# Patient Record
Sex: Male | Born: 1990 | Race: White | Hispanic: No | Marital: Married | Smoking: Never smoker
Health system: Southern US, Community
[De-identification: ages and names within clinical notes are randomized; demographics above are authoritative.]

---
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
## 2012-01-27 ENCOUNTER — Emergency Department: Payer: Self-pay | Admitting: Emergency Medicine

## 2019-10-16 ENCOUNTER — Ambulatory Visit: Payer: Self-pay | Attending: Internal Medicine

## 2019-10-16 DIAGNOSIS — Z20822 Contact with and (suspected) exposure to covid-19: Secondary | ICD-10-CM

## 2019-10-17 LAB — NOVEL CORONAVIRUS, NAA: SARS-CoV-2, NAA: NOT DETECTED

## 2022-01-01 ENCOUNTER — Other Ambulatory Visit: Payer: Self-pay

## 2022-01-01 ENCOUNTER — Emergency Department
Admission: EM | Admit: 2022-01-01 | Discharge: 2022-01-01 | Disposition: A | Discharge status: Home / Self Care | Payer: BC Managed Care – PPO | Attending: Emergency Medicine | Admitting: Emergency Medicine

## 2022-01-01 DIAGNOSIS — L03113 Cellulitis of right upper limb: Secondary | ICD-10-CM | POA: Diagnosis not present

## 2022-01-01 DIAGNOSIS — R21 Rash and other nonspecific skin eruption: Secondary | ICD-10-CM | POA: Diagnosis present

## 2022-01-01 LAB — BASIC METABOLIC PANEL
Anion gap: 8 (ref 5–15)
BUN: 16 mg/dL (ref 6–20)
CO2: 27 mmol/L (ref 22–32)
Calcium: 9.1 mg/dL (ref 8.9–10.3)
Chloride: 101 mmol/L (ref 98–111)
Creatinine, Ser: 1.35 mg/dL — ABNORMAL HIGH (ref 0.61–1.24)
GFR, Estimated: >60 mL/min (ref >60)
Glucose, Bld: 88 mg/dL (ref 70–99)
Potassium: 4 mmol/L (ref 3.5–5.1)
Sodium: 136 mmol/L (ref 135–145)

## 2022-01-01 LAB — CBC
HCT: 43.8 % (ref 39.0–52.0)
Hemoglobin: 15.3 g/dL (ref 13.0–17.0)
MCH: 29.8 pg (ref 26.0–34.0)
MCHC: 34.9 g/dL (ref 30.0–36.0)
MCV: 85.2 fL (ref 80.0–100.0)
Platelets: 267 10*3/uL (ref 150–400)
RBC: 5.14 MIL/uL (ref 4.22–5.81)
RDW: 12.4 % (ref 11.5–15.5)
WBC: 14 10*3/uL — ABNORMAL HIGH (ref 4.0–10.5)
nRBC: 0 % (ref 0.0–0.2)

## 2022-01-01 MED ORDER — SULFAMETHOXAZOLE-TRIMETHOPRIM 800-160 MG PO TABS: 1.0000 | ORAL_TABLET | Freq: Two times a day (BID) | ORAL | 0 refills | Status: AC

## 2022-01-01 MED ORDER — SULFAMETHOXAZOLE-TRIMETHOPRIM 800-160 MG PO TABS
1.0000 | ORAL_TABLET | Freq: Once | ORAL | Status: AC
  Administered 2022-01-01: 1 via ORAL
  Filled 2022-01-01: qty 1

## 2022-01-01 NOTE — Discharge Instructions (Addendum)
Use the Bactrim antibiotic twice daily for the next 7 days to treat the cellulitis/skin infection to your right arm. ? ?Keep track of the area, if it continues to enlarge significantly despite antibiotics, please return to the ED. ? ?If your elbow is very painful to move, cannot feel or use your right hand, fevers or feeling sick overall, please return to the ED.  ? ?Please take Tylenol and ibuprofen/Advil for your pain.  It is safe to take them together, or to alternate them every few hours.  Take up to 1000mg  of Tylenol at a time, up to 4 times per day.  Do not take more than 4000 mg of Tylenol in 24 hours.  For ibuprofen, take 400-600 mg, 4-5 times per day. ? ?Elevate the area, use ice and mild compression to help with the symptoms. ?

## 2022-01-01 NOTE — ED Provider Notes (Signed)
? ?[facility] ?Provider Note ? ? ? Event Date/Time  ? First MD Initiated Contact with Patient 01/01/22 2207   ?  (approximate) ? ? ?History  ? ?Rash ? ? ?HPI ? ?Rodney Fields. is a 31 y.o. male who presents to the ED for evaluation of Rash ?  ?Morbidly obese patient. ? ?Patient presents to the ED for evaluation of painful and itching area of swelling and rash to his right forearm.  Denies any trauma or injuries.  Reports possibly being bit by a bug.  Denies fevers or systemic symptoms.  No recent antibiotics. ? ?Physical Exam  ? ?Triage Vital Signs: ?ED Triage Vitals  ?Enc Vitals Group  ?   BP 01/01/22 2154 (!) 140/93  ?   Pulse Rate 01/01/22 2154 81  ?   Resp 01/01/22 2154 18  ?   Temp 01/01/22 2154 98.7 ?F (37.1 ?C)  ?   Temp Source 01/01/22 2154 Oral  ?   SpO2 01/01/22 2154 97 %  ?   Weight 01/01/22 2155 245 lb (111.1 kg)  ?   Height 01/01/22 2155 5\' 11"  (1.803 m)  ?   Head Circumference --   ?   Peak Flow --   ?   Pain Score 01/01/22 2154 6  ?   Pain Loc --   ?   Pain Edu? --   ?   Excl. in GC? --   ? ? ?Most recent vital signs: ?Vitals:  ? 01/01/22 2154  ?BP: (!) 140/93  ?Pulse: 81  ?Resp: 18  ?Temp: 98.7 ?F (37.1 ?C)  ?SpO2: 97%  ? ? ?General: Awake, no distress.  ?CV:  Good peripheral perfusion.  ?Resp:  Normal effort.  ?Abd:  No distention.  ?MSK:  No deformity noted.  Area of erythema and induration, circumscribed by a marker, as pictured below.  No fluctuance or anything coming to ahead.  No purulence.  Soft tissue swelling is present.  Full active and passive ROM of the right elbow and wrist without significant pain. ?Neuro:  No focal deficits appreciated. ?Other:   ? ? ? ? ?ED Results / Procedures / Treatments  ? ?Labs ?(all labs ordered are listed, but only abnormal results are displayed) ?Labs Reviewed  ?CBC - Abnormal; Notable for the following components:  ?    Result Value  ? WBC 14.0 (*)   ? All other components within normal limits  ?BASIC METABOLIC PANEL - Abnormal;  Notable for the following components:  ? Creatinine, Ser 1.35 (*)   ? All other components within normal limits  ? ? ?EKG ? ? ?RADIOLOGY ? ? ?Official radiology report(s): ?No results found. ? ?PROCEDURES and INTERVENTIONS: ? ?Procedures ? ?Medications  ?sulfamethoxazole-trimethoprim (BACTRIM DS) 800-160 MG per tablet 1 tablet (1 tablet Oral Given 01/01/22 2214)  ? ? ? ?IMPRESSION / MDM / ASSESSMENT AND PLAN / ED COURSE  ?I reviewed the triage vital signs and the nursing notes. ? ?31 year old male presents to the ED with area of cellulitis to his right forearm suitable to outpatient management with the initiation of antibiotics.  He looks systemically well with normal vitals on room air.  No evidence of systemic illness.  Isolated area of induration, erythema and swelling to his right forearm, as pictured above, consistent with cellulitis.  Doubt associated abscess or purulence.  Leukocytosis is noted but he has no other SIRS criteria and no evidence of sepsis.  Normal electrolytes.  Started on Bactrim and discharged with a  1 week course.  We discussed return precautions for the ED. ? ?  ? ? ?FINAL CLINICAL IMPRESSION(S) / ED DIAGNOSES  ? ?Final diagnoses:  ?Cellulitis of right upper extremity  ? ? ? ?Rx / DC Orders  ? ?ED Discharge Orders   ? ?      Ordered  ?  sulfamethoxazole-trimethoprim (BACTRIM DS) 800-160 MG tablet  2 times daily       ? 01/01/22 2211  ? ?  ?  ? ?  ? ? ? ?Note:  This document was prepared using Dragon voice recognition software and may include unintentional dictation errors. ?  ?Delton Prairie, MD ?01/01/22 2255 ? ?

## 2022-01-01 NOTE — ED Notes (Signed)
Pt d/c home per MD order. Discharge summary reviewed with pt, pt verbalizes understanding. Ambulatory off unit. No s/s of acute distress noted at discharge.  °

## 2022-01-01 NOTE — ED Triage Notes (Addendum)
Pt presents to ER c/o cellulitis type rash that he first noticed around 1130 today on his right arm.  Pt states rash has spread from its original point.  Pt endorses some pain around right elbow area.  Pt is A&O x4 at this time in NAD in triage.   ?

## 2024-03-20 ENCOUNTER — Encounter: Admitting: Urology

## 2024-03-24 ENCOUNTER — Ambulatory Visit: Admitting: Urology

## 2024-03-24 VITALS — BP 134/86 | HR 71 | Ht 71.0 in | Wt 277.0 lb

## 2024-03-24 DIAGNOSIS — Z3009 Encounter for other general counseling and advice on contraception: Secondary | ICD-10-CM

## 2024-03-24 NOTE — Patient Instructions (Signed)

## 2024-03-24 NOTE — Progress Notes (Signed)
   03/24/24 10:17 AM   Oletta Berry. 23-Feb-1991 [id number]  CC: Discuss vasectomy  HPI: 33 year old male with 2 children interested in vasectomy for permanent sterilization.  He and his wife are  95% sure they do not want more children.  He denies any urinary symptoms.    Social History:  reports that he has never smoked. He has never used smokeless tobacco. He reports that he does not drink alcohol and does not use drugs.  Physical Exam: BP 134/86   Pulse 71   Ht 5' 11 (1.803 m)   Wt 277 lb (125.6 kg)   BMI 38.63 kg/m    Constitutional:  Alert and oriented, No acute distress. Cardiovascular: No clubbing, cyanosis, or edema. Respiratory: Normal respiratory effort, no increased work of breathing. GI: Abdomen is soft, nontender, nondistended, no abdominal masses GU: Testicles 20 cc and descended bilaterally without masses, vas deferens palpable bilaterally   Assessment & Plan:   33 year old male interested in vasectomy for permanent sterilization.  We discussed the importance of being on 100% sure that he does not desire further biologic pregnancies, and that vasectomy is designed to be a permanent form of sterilization.  We discussed the risks and benefits of vasectomy at length.  Vasectomy is intended to be a permanent form of contraception, and does not produce immediate sterility.  Following vasectomy another form of contraception is required until vas occlusion is confirmed by a post-vasectomy semen analysis obtained 2-3 months after the procedure.  Even after vas occlusion is confirmed, vasectomy is not 100% reliable in preventing pregnancy, and the failure rate is approximately 10/1998.  Repeat vasectomy is required in less than 1% of patients.  He should refrain from ejaculation for 1 week after vasectomy.  Options for fertility after vasectomy include vasectomy reversal, and sperm retrieval with in vitro fertilization or ICSI.  These options are not always successful  and may be expensive.  Finally, there are other permanent and non-permanent alternatives to vasectomy available. There is no risk of erectile dysfunction, and the volume of semen will be similar to prior, as the majority of the ejaculate is from the prostate and seminal vesicles.   The procedure takes ~20 minutes.  We recommend patients take 5-10 mg of Valium 30 minutes prior, and he will need a driver post-procedure.  Local anesthetic is injected into the scrotal skin and a small segment of the vas deferens is removed, and the ends occluded. The complication rate is approximately 1-2%, and includes bleeding, infection, and development of chronic scrotal pain.  PLAN: Schedule vasectomy with nitrous  Jay Meth, MD 03/24/2024  [facility] [address], [address] [place], [place] [address] [phone]

## 2024-07-03 ENCOUNTER — Encounter: Admitting: Urology

## 2024-07-17 ENCOUNTER — Encounter: Admitting: Urology
